# Patient Record
Sex: Male | Born: 1945 | Race: White | Hispanic: No | Smoking: Current some day smoker
Health system: Southern US, Community
[De-identification: ages and names within clinical notes are randomized; demographics above are authoritative.]

## PROBLEM LIST (undated history)

## (undated) DIAGNOSIS — J449 Chronic obstructive pulmonary disease, unspecified: Secondary | ICD-10-CM

## (undated) DIAGNOSIS — K219 Gastro-esophageal reflux disease without esophagitis: Secondary | ICD-10-CM

## (undated) HISTORY — PX: GALLBLADDER SURGERY: SHX652

## (undated) HISTORY — DX: Chronic obstructive pulmonary disease, unspecified: J44.9

## (undated) HISTORY — DX: Gastro-esophageal reflux disease without esophagitis: K21.9

## (undated) HISTORY — PX: TONSILLECTOMY: SUR1361

---

## 2008-12-29 ENCOUNTER — Inpatient Hospital Stay: Payer: Self-pay | Admitting: Internal Medicine

## 2010-08-20 IMAGING — CR DG CHEST 1V PORT
1 series · 1 of 1 positions shown · non-contrast
Comparison: none

REASON FOR EXAM: chest pain
COMMENTS:

PROCEDURE:     DXR - DXR PORTABLE CHEST SINGLE VIEW  - December 28, 2008  [DATE]
RESULT:     The lung fields are clear. No pneumonia, pneumothorax or pleural
effusion is seen. Heart size is normal.

[view not recorded]
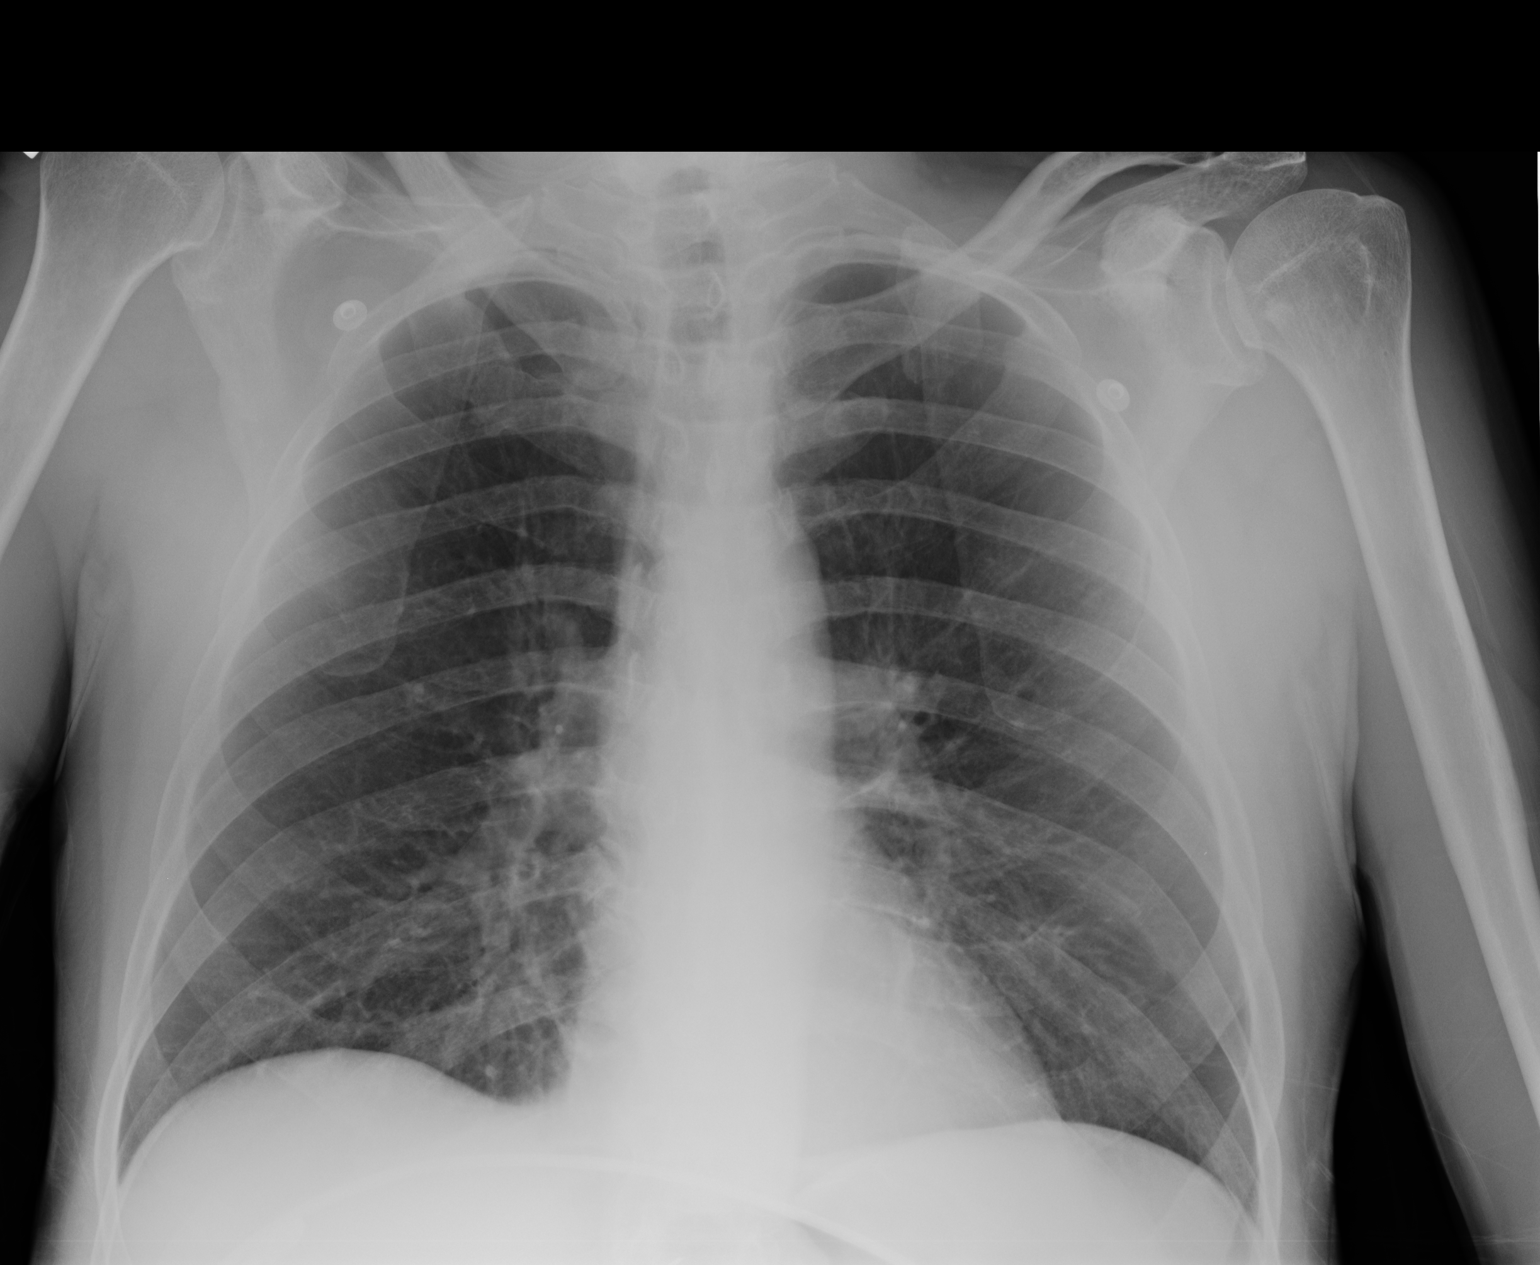

[1 of 1 positions shown; findings below may reference images not displayed]

IMPRESSION: No acute changes are identified.

## 2010-08-20 IMAGING — CT CT CHEST W/ CM
1 of 2 series · 14 of 32 positions shown, 18 images · non-contrast
Comparison: none

REASON FOR EXAM: COMMENTS:

[Series 5: lung windows · axial · 0.74mm/px · z∈[-344,-98]mm · 14 of 98 slices shown, 18 images]
[im 8/98  mediastinal]
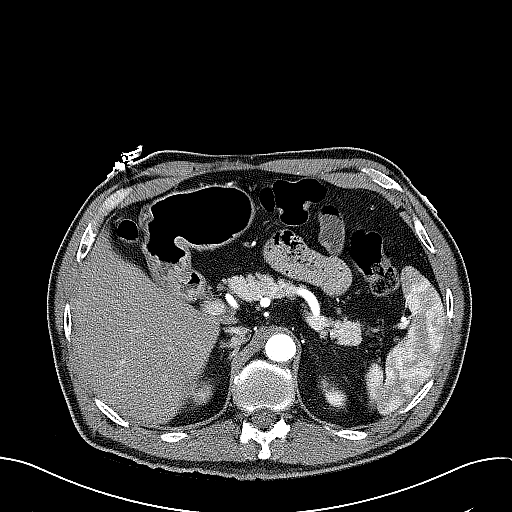
[im 8/98  lung]
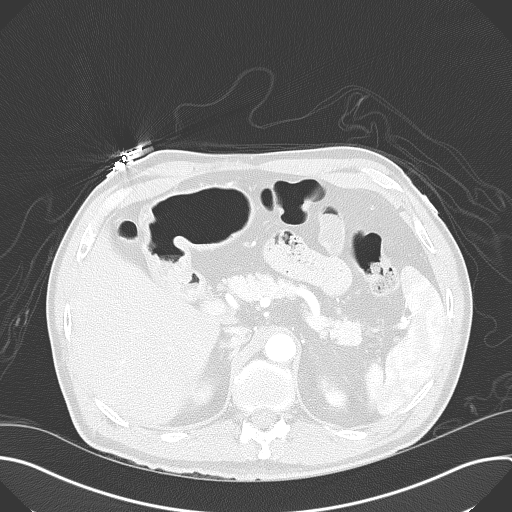
[im 15/98  lung]
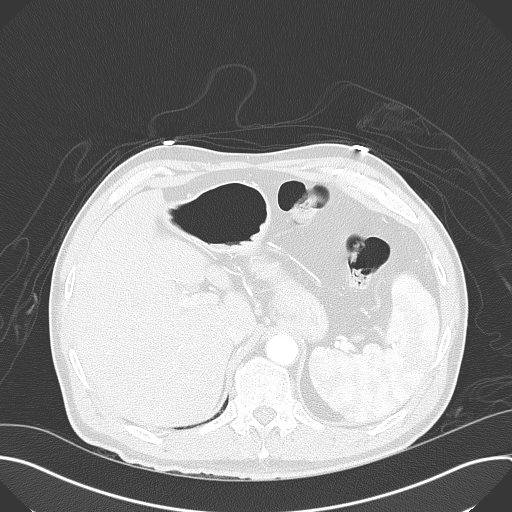
[im 23/98  lung]
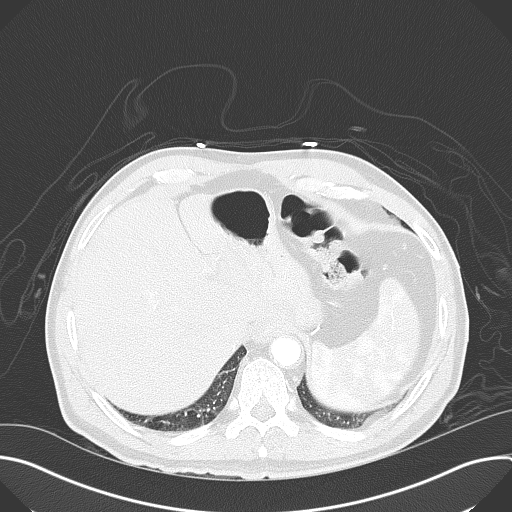
[im 30/98  lung]
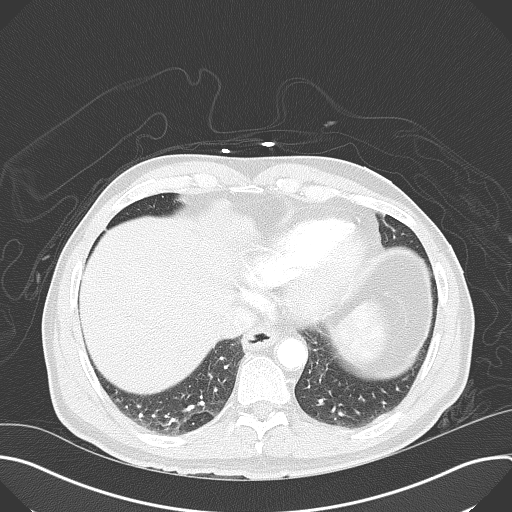
[im 38/98  mediastinal]
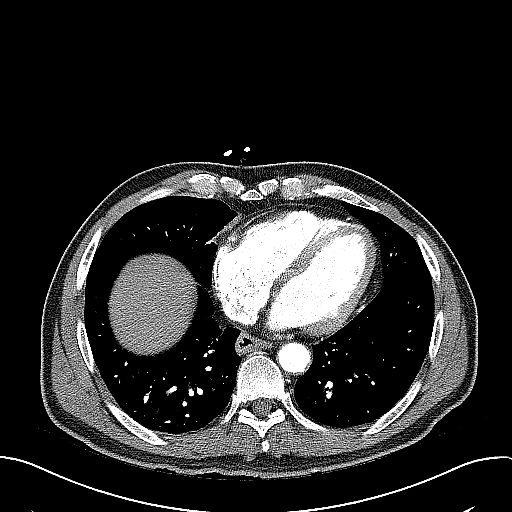
[im 38/98  lung]
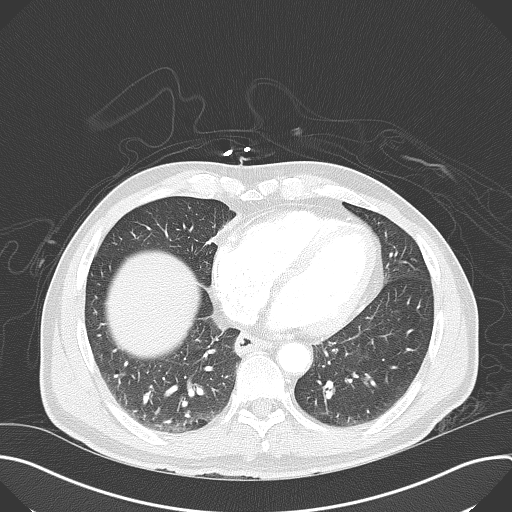
[im 45/98  lung]
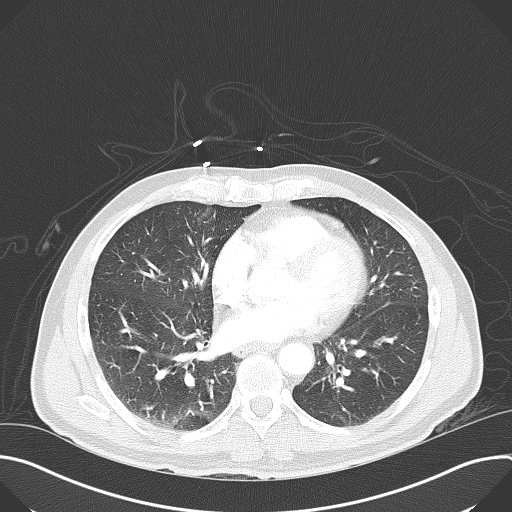
[im 46/98  lung]
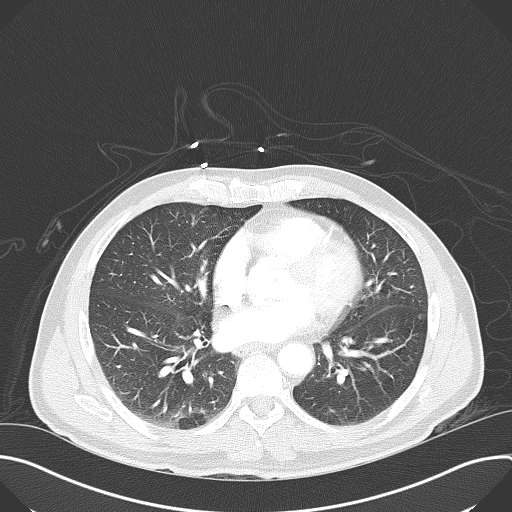
[im 49/98  lung]
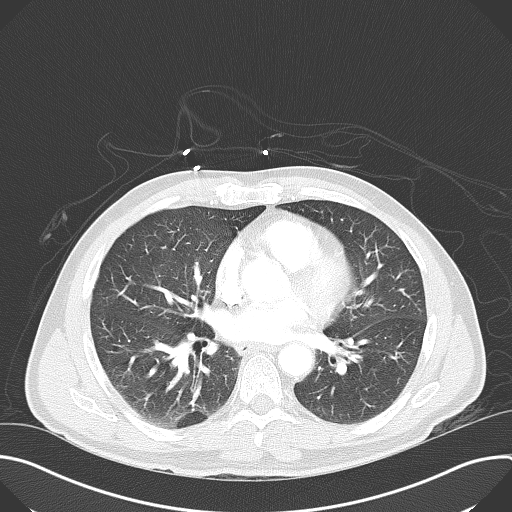
[im 53/98  mediastinal]
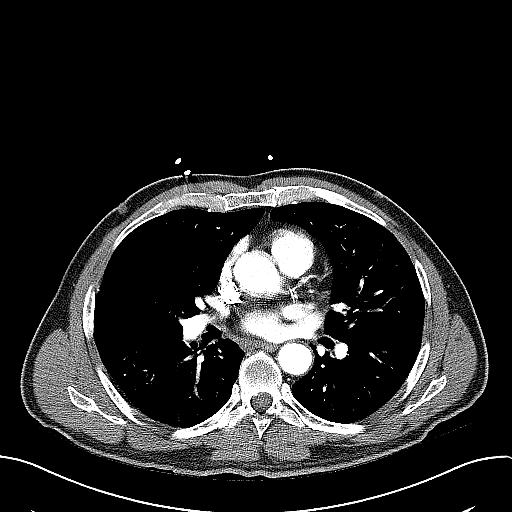
[im 53/98  lung]
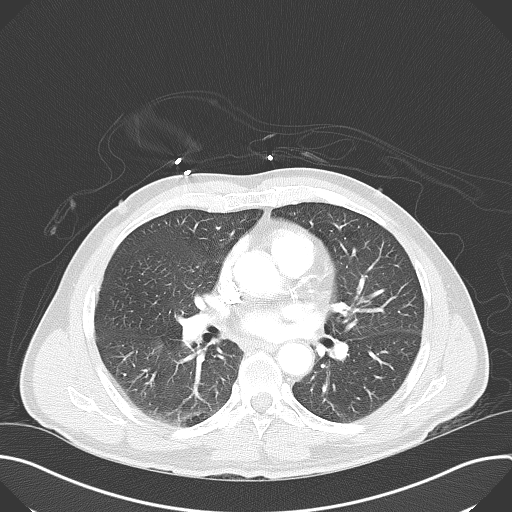
[im 60/98  lung]
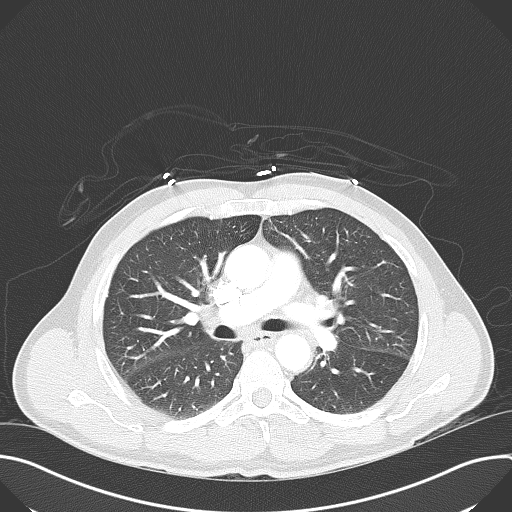
[im 68/98  lung]
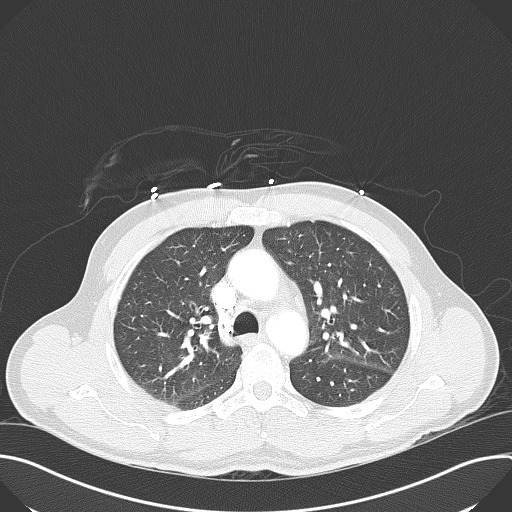
[im 75/98  lung]
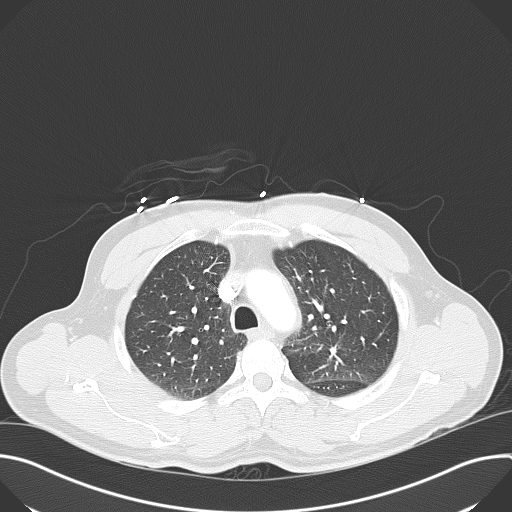
[im 83/98  mediastinal]
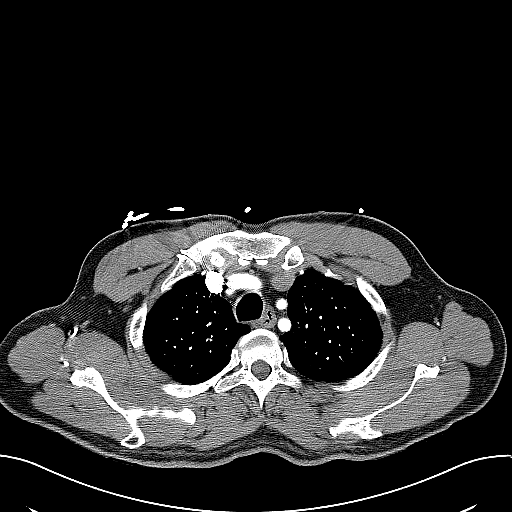
[im 83/98  lung]
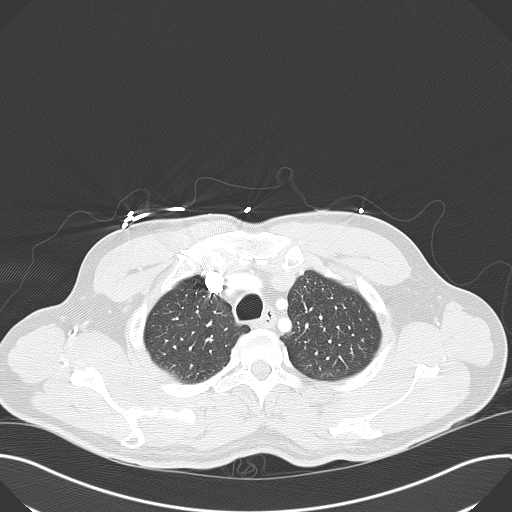
[im 90/98  lung]
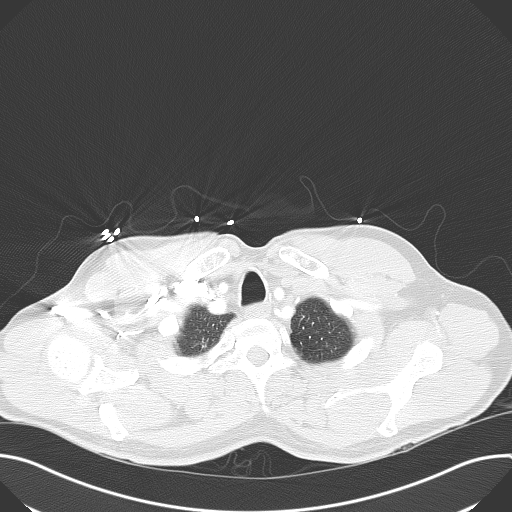

[14 of 32 positions shown; findings below may reference images not displayed]

PROCEDURE:     CT  - CT CHEST (FOR PE) W  - December 28, 2008 [DATE]

RESULT:      Axial CT scanning was performed through the chest at 3 mm
intervals and slice thicknesses following intravenous administration of 100
cc of Ysovue-YTV. Review of 3-dimensional reconstructed images was performed
separately on the [employer] monitor.

Contrast within the pulmonary arterial tree is within the limits of normal.
I do not see evidence of an acute pulmonary embolism. The cardiac chambers
are top normal in size. The caliber of the thoracic aorta is normal. I see
no pathologic sized mediastinal or hilar lymph nodes. The thoracic esophagus
is grossly normal. There is a small hiatal hernia. There is no pleural nor
pericardial effusion. At lung window settings there are mild emphysematous
changes bilaterally. I see no alveolar infiltrates and no significant
pulmonary nodules are identified. Within the upper abdomen the observed
portions of the liver and spleen appear normal. The partially imaged adrenal
glands are normal in appearance. The gallbladder is surgically absent. The
thoracic vertebral bodies are preserved in height. There is lucency noted in
the body of approximately the T4 which is of uncertain significance.
IMPRESSION: 1. I do not see evidence of an acute pulmonary embolism nor of CHF. There
are coronary artery calcifications present.
2. There are emphysematous changes in both lungs without evidence of
alveolar pneumonia.
3. There is no mediastinal nor hilar lymphadenopathy. The caliber of the
thoracic aorta is normal. There is a small hiatal hernia.

A preliminary report was sent to the [HOSPITAL] the conclusion
of the study.

The subtle lucency in the body of T4 is nonspecific. A followup nuclear bone
scan would be a useful next step.

## 2017-12-05 ENCOUNTER — Encounter: Payer: Self-pay | Admitting: Family Medicine

## 2017-12-05 ENCOUNTER — Ambulatory Visit (INDEPENDENT_AMBULATORY_CARE_PROVIDER_SITE_OTHER): Payer: Medicaid Other | Admitting: Family Medicine

## 2017-12-05 VITALS — BP 148/78 | HR 83 | Ht 67.0 in | Wt 147.0 lb

## 2017-12-05 DIAGNOSIS — Z720 Tobacco use: Secondary | ICD-10-CM

## 2017-12-05 DIAGNOSIS — Z7689 Persons encountering health services in other specified circumstances: Secondary | ICD-10-CM | POA: Diagnosis not present

## 2017-12-05 DIAGNOSIS — J441 Chronic obstructive pulmonary disease with (acute) exacerbation: Secondary | ICD-10-CM | POA: Diagnosis not present

## 2017-12-05 DIAGNOSIS — R030 Elevated blood-pressure reading, without diagnosis of hypertension: Secondary | ICD-10-CM | POA: Diagnosis not present

## 2017-12-05 DIAGNOSIS — J449 Chronic obstructive pulmonary disease, unspecified: Secondary | ICD-10-CM | POA: Insufficient documentation

## 2017-12-05 DIAGNOSIS — K219 Gastro-esophageal reflux disease without esophagitis: Secondary | ICD-10-CM

## 2017-12-05 MED ORDER — ALBUTEROL SULFATE (2.5 MG/3ML) 0.083% IN NEBU: 2.5000 mg | INHALATION_SOLUTION | Freq: Four times a day (QID) | RESPIRATORY_TRACT | 1 refills | Status: AC | PRN

## 2017-12-05 MED ORDER — PREDNISONE 20 MG PO TABS: 40.0000 mg | ORAL_TABLET | Freq: Every day | ORAL | 0 refills | Status: AC

## 2017-12-05 MED ORDER — ALBUTEROL SULFATE HFA 108 (90 BASE) MCG/ACT IN AERS: 2.0000 | INHALATION_SPRAY | Freq: Four times a day (QID) | RESPIRATORY_TRACT | 0 refills | Status: DC | PRN

## 2017-12-05 MED ORDER — OMEPRAZOLE 20 MG PO CPDR: 20.0000 mg | DELAYED_RELEASE_CAPSULE | Freq: Every day | ORAL | 3 refills | Status: AC | PRN

## 2017-12-05 MED ORDER — BUDESONIDE-FORMOTEROL FUMARATE 160-4.5 MCG/ACT IN AERO: 2.0000 | INHALATION_SPRAY | Freq: Two times a day (BID) | RESPIRATORY_TRACT | 3 refills | Status: AC

## 2017-12-05 NOTE — Progress Notes (Signed)
BP (!) 148/78   Pulse 83   Ht 5\' 7"  (1.702 m)   Wt 147 lb (66.7 kg)   SpO2 97%   BMI 23.02 kg/m    Subjective:    Patient ID: LEMAN Pope, male    DOB: 09-Feb-1946, 72 y.o.   MRN: [id number]  HPI: Gabriel Pope is a 73 y.o. male  Chief Complaint  Patient presents with  . COPD  . New Patient (Initial Visit)   Here today to establish care. Lost his insurance years ago, previously had been followed by [facility] but has not seen them in several years.   Having difficulty breathing, this has been ongoing for years from uncontrolled COPD. Used to be on albuterol and maintenance inhalers but has not been since he stopped going to [facility]. Causing poor sleep as he will wake up coughing and wheezing. Current smoker. Has been self weaning off cigarettes, down from 3 ppd to 8 cigarettes per day.   Used to on prilosec prn for GERD sxs with good control. Taking alka seltzer 3 days per week now with some relief of reflux sxs. Has tried lifestyle modifications to help with this.   No other known medical issues reported. Will obtain past medical records.    Relevant past medical, surgical, family and social history reviewed and updated as indicated. Interim medical history since our last visit reviewed. Allergies and medications reviewed and updated.  Review of Systems  Per HPI unless specifically indicated above     Objective:    BP (!) 148/78   Pulse 83   Ht 5\' 7"  (1.702 m)   Wt 147 lb (66.7 kg)   SpO2 97%   BMI 23.02 kg/m   Wt Readings from Last 3 Encounters:  12/05/17 147 lb (66.7 kg)    Physical Exam  Constitutional: He is oriented to person, place, and time. He appears well-developed and well-nourished. No distress.  HENT:  Head: Atraumatic.  Eyes: Conjunctivae and EOM are normal.  Neck: Normal range of motion. Neck supple.  Cardiovascular: Normal rate, regular rhythm and normal heart sounds.  Pulmonary/Chest: Effort normal. No respiratory  distress. He has wheezes (diffuse). He has no rales.  Abdominal: Soft. Bowel sounds are normal.  Musculoskeletal: Normal range of motion.  Neurological: He is alert and oriented to person, place, and time.  Skin: Skin is warm and dry.  Psychiatric: He has a normal mood and affect. His behavior is normal.  Nursing note and vitals reviewed.   No results found for this or any previous visit.    Assessment & Plan:   Problem List Items Addressed This Visit      Respiratory   COPD (chronic obstructive pulmonary disease) (HCC) - Primary    Uncontrolled, off inhalers for quite some time now. Will start prednisone x 5 days and symbicort, albuterol neb and inhaler for prn use. Congratulated decrease in smoking, encouraged continued efforts toward cessation      Relevant Medications   albuterol (PROVENTIL) (2.5 MG/3ML) 0.083% nebulizer solution   albuterol (PROVENTIL HFA;VENTOLIN HFA) 108 (90 Base) MCG/ACT inhaler   budesonide-formoterol (SYMBICORT) 160-4.5 MCG/ACT inhaler   predniSONE (DELTASONE) 20 MG tablet     Digestive   GERD (gastroesophageal reflux disease)    Will restart prn prilosec, lifestyle modifications      Relevant Medications   omeprazole (PRILOSEC) 20 MG capsule     Other   Tobacco use    Encouraged continued decrease/cessation. Resources offered. He  wants to continue working on it on his own      Transient elevated blood pressure    Will recheck at next visit and at that time discuss starting medication. DASH diet, exercise, get breathing under better control in meantime       Other Visit Diagnoses    Encounter to establish care           Follow up plan: Return for CPE.

## 2017-12-09 NOTE — Assessment & Plan Note (Signed)
Uncontrolled, off inhalers for quite some time now. Will start prednisone x 5 days and symbicort, albuterol neb and inhaler for prn use. Congratulated decrease in smoking, encouraged continued efforts toward cessation

## 2017-12-09 NOTE — Assessment & Plan Note (Addendum)
Encouraged continued decrease/cessation. Resources offered. He wants to continue working on it on his own

## 2017-12-09 NOTE — Assessment & Plan Note (Signed)
Will recheck at next visit and at that time discuss starting medication. DASH diet, exercise, get breathing under better control in meantime

## 2017-12-09 NOTE — Patient Instructions (Signed)
Follow up for CPE 

## 2017-12-09 NOTE — Assessment & Plan Note (Signed)
Will restart prn prilosec, lifestyle modifications

## 2018-01-03 ENCOUNTER — Encounter: Payer: Self-pay | Admitting: Family Medicine

## 2018-01-03 ENCOUNTER — Other Ambulatory Visit: Payer: Self-pay

## 2018-01-03 ENCOUNTER — Ambulatory Visit: Payer: Medicaid Other | Admitting: Family Medicine

## 2018-01-03 VITALS — BP 132/78 | HR 76 | Temp 98.4°F | Ht 67.0 in | Wt 143.0 lb

## 2018-01-03 DIAGNOSIS — Z1211 Encounter for screening for malignant neoplasm of colon: Secondary | ICD-10-CM

## 2018-01-03 DIAGNOSIS — Z23 Encounter for immunization: Secondary | ICD-10-CM | POA: Diagnosis not present

## 2018-01-03 DIAGNOSIS — Z1159 Encounter for screening for other viral diseases: Secondary | ICD-10-CM

## 2018-01-03 DIAGNOSIS — Z0000 Encounter for general adult medical examination without abnormal findings: Secondary | ICD-10-CM | POA: Diagnosis not present

## 2018-01-03 DIAGNOSIS — J441 Chronic obstructive pulmonary disease with (acute) exacerbation: Secondary | ICD-10-CM

## 2018-01-03 LAB — UA/M W/RFLX CULTURE, ROUTINE
Bilirubin, UA: NEGATIVE
Glucose, UA: NEGATIVE
Ketones, UA: NEGATIVE
LEUKOCYTES UA: NEGATIVE
Nitrite, UA: NEGATIVE
PH UA: 6.5 (ref 5.0–7.5)
PROTEIN UA: NEGATIVE
RBC UA: NEGATIVE
SPEC GRAV UA: 1.01 (ref 1.005–1.030)
Urobilinogen, Ur: 0.2 mg/dL (ref 0.2–1.0)

## 2018-01-03 MED ORDER — FLUTICASONE-UMECLIDIN-VILANT 100-62.5-25 MCG/INH IN AEPB: 1.0000 | INHALATION_SPRAY | Freq: Every day | RESPIRATORY_TRACT | 6 refills | Status: AC

## 2018-01-03 NOTE — Progress Notes (Signed)
BP 132/78   Pulse 76   Temp 98.4 F (36.9 C) (Oral)   Ht 5\' 7"  (1.702 m)   Wt 143 lb (64.9 kg)   SpO2 95%   BMI 22.40 kg/m    Subjective:    Patient ID: Gabriel Pope, male    DOB: 1946/01/28, 72 y.o.   MRN: [id number]  HPI: Gabriel FishLonnie M Cronin is a 72 y.o. male presenting on 01/03/2018 for comprehensive medical examination. Current medical complaints include:none  Last colonoscopy was "a number of years ago".   Working on tapering off his cigarettes.   He currently lives with: Interim Problems from his last visit: no  Depression Screen done today and results listed below:  [facility] 01/03/2018 12/05/2017  Decreased Interest 0 0  Down, Depressed, Hopeless 0 0  PHQ - 2 Score 0 0  Altered sleeping 2 2  Tired, decreased energy 0 0  Change in appetite 0 0  Feeling bad or failure about yourself  0 0  Trouble concentrating 0 0  Moving slowly or fidgety/restless 0 0  Suicidal thoughts 0 0  PHQ-9 Score 2 2    The patient does not have a history of falls. I did not complete a risk assessment for falls. A plan of care for falls was not documented.   Past Medical History:  Past Medical History:  Diagnosis Date  . COPD (chronic obstructive pulmonary disease) (HCC)   . GERD (gastroesophageal reflux disease)     Surgical History:  Past Surgical History:  Procedure Laterality Date  . GALLBLADDER SURGERY    . TONSILLECTOMY      Medications:  Current Outpatient Medications on File Prior to Visit  Medication Sig  . albuterol (PROVENTIL HFA;VENTOLIN HFA) 108 (90 Base) MCG/ACT inhaler Inhale 2 puffs into the lungs every 6 (six) hours as needed for wheezing or shortness of breath.  Marland Kitchen. albuterol (PROVENTIL) (2.5 MG/3ML) 0.083% nebulizer solution Take 3 mLs (2.5 mg total) by nebulization every 6 (six) hours as needed for wheezing or shortness of breath.  . budesonide-formoterol (SYMBICORT) 160-4.5 MCG/ACT inhaler Inhale 2 puffs into the lungs 2 (two) times  daily.  Marland Kitchen. omeprazole (PRILOSEC) 20 MG capsule Take 1 capsule (20 mg total) by mouth daily as needed.  . predniSONE (DELTASONE) 20 MG tablet Take 2 tablets (40 mg total) by mouth daily with breakfast.   No current facility-administered medications on file prior to visit.     Allergies:  No Known Allergies  Social History:  Social History   Socioeconomic History  . Marital status: Divorced    Spouse name: Not on file  . Number of children: Not on file  . Years of education: Not on file  . Highest education level: Not on file  Occupational History  . Not on file  Social Needs  . Financial resource strain: Not on file  . Food insecurity:    Worry: Not on file    Inability: Not on file  . Transportation needs:    Medical: Not on file    Non-medical: Not on file  Tobacco Use  . Smoking status: Current Some Day Smoker    Types: Cigarettes  . Smokeless tobacco: Never Used  Substance and Sexual Activity  . Alcohol use: Never    Frequency: Never  . Drug use: Not Currently  . Sexual activity: Not on file  Lifestyle  . Physical activity:    Days per week: Not on file    Minutes per session: Not  on file  . Stress: Not on file  Relationships  . Social connections:    Talks on phone: Not on file    Gets together: Not on file    Attends religious service: Not on file    Active member of club or organization: Not on file    Attends meetings of clubs or organizations: Not on file    Relationship status: Not on file  . Intimate partner violence:    Fear of current or ex partner: Not on file    Emotionally abused: Not on file    Physically abused: Not on file    Forced sexual activity: Not on file  Other Topics Concern  . Not on file  Social History Narrative  . Not on file   Social History   Tobacco Use  Smoking Status Current Some Day Smoker  . Types: Cigarettes  Smokeless Tobacco Never Used   Social History   Substance and Sexual Activity  Alcohol Use Never  .  Frequency: Never    Family History:  Family History  Problem Relation Age of Onset  . Heart attack Mother   . Kidney disease Father     Past medical history, surgical history, medications, allergies, family history and social history reviewed with patient today and changes made to appropriate areas of the chart.   Review of Systems - General ROS: negative Psychological ROS: negative Ophthalmic ROS: negative ENT ROS: negative Allergy and Immunology ROS: negative Hematological and Lymphatic ROS: negative Endocrine ROS: negative Breast ROS: negative for breast lumps Respiratory ROS: positive for - wheezing Cardiovascular ROS: no chest pain or dyspnea on exertion Gastrointestinal ROS: no abdominal pain, change in bowel habits, or black or bloody stools Genito-Urinary ROS: no dysuria, trouble voiding, or hematuria Musculoskeletal ROS: negative Neurological ROS: no TIA or stroke symptoms Dermatological ROS: negative All other ROS negative except what is listed above and in the HPI.      Objective:    BP 132/78   Pulse 76   Temp 98.4 F (36.9 C) (Oral)   Ht 5\' 7"  (1.702 m)   Wt 143 lb (64.9 kg)   SpO2 95%   BMI 22.40 kg/m   Wt Readings from Last 3 Encounters:  01/03/18 143 lb (64.9 kg)  12/05/17 147 lb (66.7 kg)    Physical Exam  Constitutional: He is oriented to person, place, and time. He appears well-developed and well-nourished. No distress.  HENT:  Head: Atraumatic.  Right Ear: External ear normal.  Left Ear: External ear normal.  Nose: Nose normal.  Mouth/Throat: Oropharynx is clear and moist.  Eyes: Pupils are equal, round, and reactive to light. Conjunctivae are normal. No scleral icterus.  Neck: Normal range of motion. Neck supple.  Cardiovascular: Normal rate, regular rhythm, normal heart sounds and intact distal pulses.  No murmur heard. Pulmonary/Chest: Effort normal. No respiratory distress (minimal). He has wheezes.  Abdominal: Soft. Bowel sounds are  normal. He exhibits no distension and no mass. There is no tenderness. There is no guarding.  Genitourinary:  Genitourinary Comments: Declines GU exam  Musculoskeletal: Normal range of motion. He exhibits no edema or tenderness.  Neurological: He is alert and oriented to person, place, and time. He has normal reflexes.  Skin: Skin is warm and dry. No rash noted.  Psychiatric: He has a normal mood and affect. His behavior is normal.  Nursing note and vitals reviewed.   Results for orders placed or performed in visit on 01/03/18  Hepatitis C antibody  Result Value Ref Range   Hep C Virus Ab <0.1 0.0 - 0.9 s/co ratio  CBC with Differential/Platelet  Result Value Ref Range   WBC 8.4 3.4 - 10.8 x10E3/uL   RBC 4.93 4.14 - 5.80 x10E6/uL   Hemoglobin 14.6 13.0 - 17.7 g/dL   Hematocrit 96.0 45.4 - 51.0 %   MCV 87 79 - 97 fL   MCH 29.6 26.6 - 33.0 pg   MCHC 34.0 31.5 - 35.7 g/dL   RDW 09.8 (L) 11.9 - 14.7 %   Platelets 205 150 - 450 x10E3/uL   Neutrophils 59 Not Estab. %   Lymphs 27 Not Estab. %   Monocytes 6 Not Estab. %   Eos 7 Not Estab. %   Basos 1 Not Estab. %   Neutrophils Absolute 4.9 1.4 - 7.0 x10E3/uL   Lymphocytes Absolute 2.3 0.7 - 3.1 x10E3/uL   Monocytes Absolute 0.5 0.1 - 0.9 x10E3/uL   EOS (ABSOLUTE) 0.6 (H) 0.0 - 0.4 x10E3/uL   Basophils Absolute 0.1 0.0 - 0.2 x10E3/uL   Immature Granulocytes 0 Not Estab. %   Immature Grans (Abs) 0.0 0.0 - 0.1 x10E3/uL  Comprehensive metabolic panel  Result Value Ref Range   Glucose 84 65 - 99 mg/dL   BUN 11 8 - 27 mg/dL   Creatinine, Ser 8.29 0.76 - 1.27 mg/dL   GFR calc non Af Amer 67 >59 mL/min/1.73   GFR calc Af Amer 77 >59 mL/min/1.73   BUN/Creatinine Ratio 10 10 - 24   Sodium 141 134 - 144 mmol/L   Potassium 5.2 3.5 - 5.2 mmol/L   Chloride 102 96 - 106 mmol/L   CO2 25 20 - 29 mmol/L   Calcium 9.8 8.6 - 10.2 mg/dL   Total Protein 7.0 6.0 - 8.5 g/dL   Albumin 4.6 3.5 - 4.8 g/dL   Globulin, Total 2.4 1.5 - 4.5 g/dL    Albumin/Globulin Ratio 1.9 1.2 - 2.2   Bilirubin Total 0.4 0.0 - 1.2 mg/dL   Alkaline Phosphatase 77 39 - 117 IU/L   AST 19 0 - 40 IU/L   ALT 19 0 - 44 IU/L  Lipid Panel w/o Chol/HDL Ratio  Result Value Ref Range   Cholesterol, Total 188 100 - 199 mg/dL   Triglycerides 562 (H) 0 - 149 mg/dL   HDL 31 (L) >13 mg/dL   VLDL Cholesterol Cal 51 (H) 5 - 40 mg/dL   LDL Calculated 086 (H) 0 - 99 mg/dL  UA/M w/rflx Culture, Routine  Result Value Ref Range   Specific Gravity, UA 1.010 1.005 - 1.030   pH, UA 6.5 5.0 - 7.5   Color, UA Yellow Yellow   Appearance Ur Clear Clear   Leukocytes, UA Negative Negative   Protein, UA Negative Negative/Trace   Glucose, UA Negative Negative   Ketones, UA Negative Negative   RBC, UA Negative Negative   Bilirubin, UA Negative Negative   Urobilinogen, Ur 0.2 0.2 - 1.0 mg/dL   Nitrite, UA Negative Negative      Assessment & Plan:   Problem List Items Addressed This Visit      Respiratory   COPD (chronic obstructive pulmonary disease) (HCC)    Improved on symbicort, will start trelegy instead to improve control. Continue tapering on cigarette usage      Relevant Medications   Fluticasone-Umeclidin-Vilant (TRELEGY ELLIPTA) 100-62.5-25 MCG/INH AEPB    Other Visit Diagnoses    Annual physical exam    -  Primary   Relevant Orders  CBC with Differential/Platelet (Completed)   Comprehensive metabolic panel (Completed)   Lipid Panel w/o Chol/HDL Ratio (Completed)   UA/M w/rflx Culture, Routine (Completed)   Screening for colon cancer       Relevant Orders   Ambulatory referral to Gastroenterology   Need for hepatitis C screening test       Relevant Orders   Hepatitis C antibody (Completed)   Need for pneumococcal vaccine       Relevant Orders   Pneumococcal conjugate vaccine 13-valent IM (Completed)   Need for Td vaccine       Relevant Orders   Td vaccine greater than or equal to 7yo preservative free IM (Completed)       Discussed aspirin  prophylaxis for myocardial infarction prevention and decision was it was not indicated  LABORATORY TESTING:  Health maintenance labs ordered today as discussed above.   The natural history of prostate cancer and ongoing controversy regarding screening and potential treatment outcomes of prostate cancer has been discussed with the patient. The meaning of a false positive PSA and a false negative PSA has been discussed. He indicates understanding of the limitations of this screening test and wishes not to proceed with screening PSA testing.   IMMUNIZATIONS:   - Tdap: Tetanus vaccination status reviewed: Td vaccination indicated and given today. - Influenza: Refused - Pneumovax: Not applicable - Prevnar: Administered today - HPV: Not applicable - Zostavax vaccine: Refused  SCREENING: - Colonoscopy: Ordered today  Discussed with patient purpose of the colonoscopy is to detect colon cancer at curable precancerous or early stages   PATIENT COUNSELING:    Sexuality: Discussed sexually transmitted diseases, partner selection, use of condoms, avoidance of unintended pregnancy  and contraceptive alternatives.   Advised to avoid cigarette smoking.  I discussed with the patient that most people either abstain from alcohol or drink within safe limits (<=14/week and <=4 drinks/occasion for males, <=7/weeks and <= 3 drinks/occasion for females) and that the risk for alcohol disorders and other health effects rises proportionally with the number of drinks per week and how often a drinker exceeds daily limits.  Discussed cessation/primary prevention of drug use and availability of treatment for abuse.   Diet: Encouraged to adjust caloric intake to maintain  or achieve ideal body weight, to reduce intake of dietary saturated fat and total fat, to limit sodium intake by avoiding high sodium foods and not adding table salt, and to maintain adequate dietary potassium and calcium preferably from fresh  fruits, vegetables, and low-fat dairy products.    stressed the importance of regular exercise  Injury prevention: Discussed safety belts, safety helmets, smoke detector, smoking near bedding or upholstery.   Dental health: Discussed importance of regular tooth brushing, flossing, and dental visits.   Follow up plan: NEXT PREVENTATIVE PHYSICAL DUE IN 1 YEAR. Return in about 4 weeks (around 01/31/2018) for COPD.

## 2018-01-03 NOTE — Patient Instructions (Signed)
Td Vaccine (Tetanus and Diphtheria): What You Need to Know 1. Why get vaccinated? Tetanus  and diphtheria are very serious diseases. They are rare in the [place] today, but people who do become infected often have severe complications. Td vaccine is used to protect adolescents and adults from both of these diseases. Both tetanus and diphtheria are infections caused by bacteria. Diphtheria spreads from person to person through coughing or sneezing. Tetanus-causing bacteria enter the body through cuts, scratches, or wounds. TETANUS (lockjaw) causes painful muscle tightening and stiffness, usually all over the body.  It can lead to tightening of muscles in the head and neck so you can't open your mouth, swallow, or sometimes even breathe. Tetanus kills about 1 out of every 10 people who are infected even after receiving the best medical care.  DIPHTHERIA can cause a thick coating to form in the back of the throat.  It can lead to breathing problems, paralysis, heart failure, and death.  Before vaccines, as many as 200,000 cases of diphtheria and hundreds of cases of tetanus were reported in the [place] each year. Since vaccination began, reports of cases for both diseases have dropped by about 99%. 2. Td vaccine Td vaccine can protect adolescents and adults from tetanus and diphtheria. Td is usually given as a booster dose every 10 years but it can also be given earlier after a severe and dirty wound or burn. Another vaccine, called Tdap, which protects against pertussis in addition to tetanus and diphtheria, is sometimes recommended instead of Td vaccine. Your doctor or the person giving you the vaccine can give you more information. Td may safely be given at the same time as other vaccines. 3. Some people should not get this vaccine  A person who has ever had a life-threatening allergic reaction after a previous dose of any tetanus or diphtheria containing vaccine, OR has a severe  allergy to any part of this vaccine, should not get Td vaccine. Tell the person giving the vaccine about any severe allergies.  Talk to your doctor if you: ? had severe pain or swelling after any vaccine containing diphtheria or tetanus, ? ever had a condition called Guillain Barre Syndrome (GBS), ? aren't feeling well on the day the shot is scheduled. 4. What are the risks from Td vaccine? With any medicine, including vaccines, there is a chance of side effects. These are usually mild and go away on their own. Serious reactions are also possible but are rare. Most people who get Td vaccine do not have any problems with it. Mild problems following Td vaccine: (Did not interfere with activities)  Pain where the shot was given (about 8 people in 10)  Redness or swelling where the shot was given (about 1 person in 4)  Mild fever (rare)  Headache (about 1 person in 4)  Tiredness (about 1 person in 4)  Moderate problems following Td vaccine: (Interfered with activities, but did not require medical attention)  Fever over 102F (rare)  Severe problems following Td vaccine: (Unable to perform usual activities; required medical attention)  Swelling, severe pain, bleeding and/or redness in the arm where the shot was given (rare).  Problems that could happen after any vaccine:  People sometimes faint after a medical procedure, including vaccination. Sitting or lying down for about 15 minutes can help prevent fainting, and injuries caused by a fall. Tell your doctor if you feel dizzy, or have vision changes or ringing in the ears.  Some people get   severe pain in the shoulder and have difficulty moving the arm where a shot was given. This happens very rarely.  Any medication can cause a severe allergic reaction. Such reactions from a vaccine are very rare, estimated at fewer than 1 in a million doses, and would happen within a few minutes to a few hours after the vaccination. As with any  medicine, there is a very remote chance of a vaccine causing a serious injury or death. The safety of vaccines is always being monitored. For more information, visit: http://floyd.org/ 5. What if there is a serious reaction? What should I look for? Look for anything that concerns you, such as signs of a severe allergic reaction, very high fever, or unusual behavior. Signs of a severe allergic reaction can include hives, swelling of the face and throat, difficulty breathing, a fast heartbeat, dizziness, and weakness. These would usually start a few minutes to a few hours after the vaccination. What should I do?  If you think it is a severe allergic reaction or other emergency that can't wait, call 9-1-1 or get the person to the nearest hospital. Otherwise, call your doctor.  Afterward, the reaction should be reported to the Vaccine Adverse Event Reporting System (VAERS). Your doctor might file this report, or you can do it yourself through the VAERS web site at www.vaers.LAgents.no, or by calling 1-231 453 9421. ? VAERS does not give medical advice. 6. The National Vaccine Injury Compensation Program The [employer] Vaccine Injury Compensation Program (VICP) is a federal program that was created to compensate people who may have been injured by certain vaccines. Persons who believe they may have been injured by a vaccine can learn about the program and about filing a claim by calling 1-956-546-9571 or visiting the VICP website at SpiritualWord.at. There is a time limit to file a claim for compensation. 7. How can I learn more?  Ask your doctor. He or she can give you the vaccine package insert or suggest other sources of information.  Call your local or state health department.  Contact the Centers for Disease Control and Prevention (CDC): ? Call 865-040-5987 (1-800-CDC-INFO) ? Visit CDC's website at PicCapture.uy CDC Td Vaccine VIS (06/01/15) This information is  not intended to replace advice given to you by your health care provider. Make sure you discuss any questions you have with your health care provider. Document Released: 12/04/2005 Document Revised: 10/28/2015 Document Reviewed: 10/28/2015 Elsevier Interactive Patient Education  2017 [employer]. Your Child's First Vaccines: What You Need to Know The vaccines covered on this statement are those most likely to be given during the same visits during infancy and early childhood. Other vaccines (including measles, mumps, and rubella; varicella; rotavirus; influenza; and hepatitis A) are also routinely recommended during the first five years of life. Your child will get these vaccines today: _____DTaP _____Hib _____Hepatitis B _____Polio _____PCV13 (Provider: Check appropriate blanks.) 1. Why get vaccinated? Vaccine-preventable diseases are much less common than they used to be, thanks to vaccination. But they have not gone away. Outbreaks of some of these diseases still occur across the [place]. When fewer babies get vaccinated, more babies get sick. Seven childhood diseases that can be prevented by vaccines: 1. Diphtheria (the 'D' in DTaP vaccine)  Signs and symptoms include a thick coating in the back of the throat that can make it hard to breathe.  Diphtheria can lead to breathing problems, paralysis and heart failure. ? About 15,000 people died each year in the [place] from diphtheria before  there was a vaccine. 2. Tetanus (the 'T' in DTaP vaccine, also known as Lockjaw)  Signs and symptoms include painful tightening of the muscles, usually all over the body.  Tetanus can lead to stiffness of the jaw that can make it difficult to open the mouth or swallow. ? Tetanus kills about 1 person out of every 10 who get it. 3. Pertussis (the 'P' in DTaP vaccine, also known as Whooping Cough)  Signs and symptoms include violent coughing spells that can make it hard for a baby to eat, drink, or  breathe. These spells can last for several weeks.  Pertussis can lead to pneumonia, seizures, brain damage, or death. Pertussis can be very dangerous in infants. ? Most pertussis deaths are in babies younger than 53 months of age. 4. Hib ( Haemophilus influenzae type b)  Signs and symptoms can include fever, headache, stiff neck, cough, and shortness of breath. There might not be any signs or symptoms in mild cases.  Hib can lead to meningitis (infection of the brain and spinal cord coverings); pneumonia; infections of the ears, sinuses, blood, joints, bones, and covering of the heart; brain damage; severe swelling of the throat, making it hard to breathe; and deafness. ? Children younger than 79 years of age are at greatest risk for Hib disease. 5. Hepatitis B  Signs and symptoms include tiredness, diarrhea and vomiting, jaundice (yellow skin or eyes), and pain in muscles, joints and stomach. But usually there are no signs or symptoms at all.  Hepatitis B can lead to liver damage, and liver cancer. Some people develop chronic (long term) hepatitis B infection. These people might not look or feel sick, but they can infect others. ? Hepatitis B can cause liver damage and cancer in 1 child out of 4 who are chronically infected. 6. Polio  Signs and symptoms can include flu-like illness, or there may be no signs or symptoms at all.  Polio can lead to permanent paralysis (can't move an arm or leg, or sometimes can't breathe) and death. ? In the 1950s, polio paralyzed more than 15,000 people every year in the [place] 7. Pneumococcal disease  Signs and symptoms include fever, chills, cough, and chest pain. In infants, symptoms can also include meningitis, seizures, and sometimes rash.  Pneumococcal disease can lead to meningitis (infection of the brain and spinal cord coverings); infections of the ears, sinuses and blood; pneumonia; deafness; and brain damage. ? About 1 out of 15 children who get  pneumococcal meningitis will die from the infection. Children usually catch these diseases from other children or adults, who might not even know they are infected. A mother infected with hepatitis B can infect her baby at birth. Tetanus enters the body through a cut or wound; it is not spread from person to person. Vaccines that protect your baby from these seven diseases:  Vaccine: DTaP (Diphtheria, Tetanus, Pertussis) ? Number of doses: 5 ? Recommended ages: 2 months, 4 months, 6 months, 15-18 months, 4-6 years ? Other information: Some children get a vaccine called DT (Diphtheria &amp; tetanus) instead of DTaP.  Vaccine: Hepatitis B ? Number of doses: 3 ? Recommended ages: Birth, 1-2 months, 6-18 months  Vaccine: Polio ? Number of doses: 4 ? Recommended ages: 2 months, 4 months, 6-18 months, 4-6 years ? Other information: An additional dose of polio vaccine may be recommended for travel to certain countries.  Vaccine: Hib (Haemophilus influenzae type b) ? Number of doses: 3 or 4 ? Recommended ages:  2 months, 4 months, (6 months), 12-15 months ? Other information: There are several Hib vaccines. With one of them the 29-month dose is not needed.  Vaccine: Pneumococcal (PCV13) ? Number of doses: 4 ? Recommended ages: 2 months, 4 months, 6 months, 12-15 months ? Other information: Older children with certain health conditions also need this vaccine. Your healthcare provider might offer some of these vaccines as combination vaccines-several vaccines given in the same shot. Combination vaccines are as safe and effective as the individual vaccines, and can mean fewer shots for your baby. 2. Some children should not get certain vaccines Most children can safely get all of these vaccines. But there are some exceptions:  A child who has a mild cold or other illness on the day vaccinations are scheduled may be vaccinated. A child who is moderately or severely ill on the day of vaccinations  might be asked to come back for them at a later date.  Any child who had a life-threatening allergic reaction after getting a vaccine should not get another dose of that vaccine. Tell the person giving the vaccines if your child has ever had a severe reaction after any vaccination.  A child who has a severe (life-threatening) allergy to a substance should not get a vaccine that contains that substance. Tell the person giving your child the vaccines if your child has any severe allergies that you are aware of.  Talk to your doctor before your child gets:  DTaP vaccine, if your child ever had any of these reactions after a previous dose of DTaP: ? A brain or nervous system disease within 7 days, ? Non-stop crying for 3 hours or more, ? A seizure or collapse, ? A fever of over 105F.  PCV13 vaccine, if your child ever had a severe reaction after a dose of DTaP (or other vaccine containing diphtheria toxoid), or after a dose of PCV7, an earlier pneumococcal vaccine. 3. What are the risks of a vaccine reaction? With any medicine, including vaccines, there is a chance of side effects. These are usually mild and go away on their own. Most vaccine reactions are not serious: tenderness, redness, or swelling where the shot was given; or a mild fever. These happen soon after the shot is given and go away within a day or two. They happen with up to about half of vaccinations, depending on the vaccine. Serious reactions are also possible but are rare. Polio, Hepatitis B and Hib vaccines have been associated only with mild reactions. DTaP and pneumococcal vaccines have also been associated with other problems:  DTaP vaccine ? Mild problems: Fussiness (up to 1 child in 3); tiredness or loss of appetite (up to 1 child in 10); vomiting (up to 1 child in 50); swelling of the entire arm or leg for 1-7 days (up to 1 child in 30)-usually after the 4th or 5th dose. ? Moderate problems: Seizure (1 child in 14,000);  non-stop crying for 3 hours or longer (up to 1 child in 1,000); fever over 105F (1 child in 16,000). ? Serious problems: Long term seizures, coma, lowered consciousness, and permanent brain damage have been reported following DTaP vaccination. These reports are extremely rare.  Pneumococcal vaccine ? Mild problems: Drowsiness or temporary loss of appetite (about 1 child in 2 or 3); fussiness (about 8 children in 10). ? Moderate problems: Fever over 102.54F (about 1 child in 20).  After any vaccine: Any medication can cause a severe allergic reaction. Such reactions from  a vaccine are very rare, estimated at about 1 in a million doses, and would happen within a few minutes to a few hours after the vaccination. As with any medicine, there is a very remote chance of a vaccine causing a serious injury or death. The safety of vaccines is always being monitored. For more information, visit: http://floyd.org/www.cdc.gov/vaccinesafety/ 4. What if there is a serious reaction? What should I look for? Look for anything that concerns you, such as signs of a severe allergic reaction, very high fever, or unusual behavior. Signs of a severe allergic reaction can include hives, swelling of the face and throat, and difficulty breathing. In infants, signs of an allergic reaction might also include fever, sleepiness, and disinterest in eating. In older children signs might include a fast heartbeat, dizziness, and weakness. These would usually start a few minutes to a few hours after the vaccination. What should I do?  If you think it is a severe allergic reaction or other emergency that can't wait, call 9-1-1 or get the person to the nearest hospital. Otherwise, call your doctor.  Afterward, the reaction should be reported to the Vaccine Adverse Event Reporting System (VAERS). Your doctor should file this report, or you can do it yourself through the VAERS web site at www.vaers.LAgents.nohhs.gov, or by calling 1-(808) 029-6529.  VAERS does  not give medical advice. 5. The National Vaccine Injury Compensation Program The National Vaccine Injury Compensation Program (VICP) is a federal program that was created to compensate people who may have been injured by certain vaccines. Persons who believe they may have been injured by a vaccine can learn about the program and about filing a claim by calling 1-912-783-2888 or visiting the VICP website at SpiritualWord.atwww.hrsa.gov/vaccinecompensation. There is a time limit to file a claim for compensation. 6. How can I learn more?  Ask your healthcare provider. He or she can give you the vaccine package insert or suggest other sources of information.  Call your local or state health department.  Contact the Centers for Disease Control and Prevention (CDC): ? Call 30629145601-873-405-9681 (1-800-CDC-INFO) ? Visit CDC's website at PicCapture.uywww.cdc.gov/vaccines or SaveSearches.co.nzwww.cdc.gov/hepatitis Vaccine Information Statement, Multi Pediatric Vaccines (12/25/2013) This information is not intended to replace advice given to you by your health care provider. Make sure you discuss any questions you have with your health care provider. Document Released: 07/26/2007 Document Revised: 12/22/2015 Document Reviewed: 12/22/2015 Elsevier Interactive Patient Education  2017 [employer] Pneumococcal Conjugate Vaccine (PCV13) What You Need to Know 1. Why get vaccinated? Vaccination can protect both children and adults from pneumococcal disease. Pneumococcal disease is caused by bacteria that can spread from person to person through close contact. It can cause ear infections, and it can also lead to more serious infections of the:  Lungs (pneumonia),  Blood (bacteremia), and  Covering of the brain and spinal cord (meningitis).  Pneumococcal pneumonia is most common among adults. Pneumococcal meningitis can cause deafness and brain damage, and it kills about 1 child in 10 who get it. Anyone can get pneumococcal disease, but children under 122  years of age and adults 2865 years and older, people with certain medical conditions, and cigarette smokers are at the highest risk. Before there was a vaccine, the [place] saw:  more than 700 cases of meningitis,  about 13,000 blood infections,  about 5 million ear infections, and  about 200 deaths  in children under 5 each year from pneumococcal disease. Since vaccine became available, severe pneumococcal disease in these children has fallen  by 88%. About 18,000 older adults die of pneumococcal disease each year in the [place]. Treatment of pneumococcal infections with penicillin and other drugs is not as effective as it used to be, because some strains of the disease have become resistant to these drugs. This makes prevention of the disease, through vaccination, even more important. 2. PCV13 vaccine Pneumococcal conjugate vaccine (called PCV13) protects against 13 types of pneumococcal bacteria. PCV13 is routinely given to children at 2, 4, 6, and 34-89 months of age. It is also recommended for children and adults 8 to 34 years of age with certain health conditions, and for all adults 54 years of age and older. Your doctor can give you details. 3. Some people should not get this vaccine Anyone who has ever had a life-threatening allergic reaction to a dose of this vaccine, to an earlier pneumococcal vaccine called PCV7, or to any vaccine containing diphtheria toxoid (for example, DTaP), should not get PCV13. Anyone with a severe allergy to any component of PCV13 should not get the vaccine. Tell your doctor if the person being vaccinated has any severe allergies. If the person scheduled for vaccination is not feeling well, your healthcare provider might decide to reschedule the shot on another day. 4. Risks of a vaccine reaction With any medicine, including vaccines, there is a chance of reactions. These are usually mild and go away on their own, but serious reactions are also  possible. Problems reported following PCV13 varied by age and dose in the series. The most common problems reported among children were:  About half became drowsy after the shot, had a temporary loss of appetite, or had redness or tenderness where the shot was given.  About 1 out of 3 had swelling where the shot was given.  About 1 out of 3 had a mild fever, and about 1 in 20 had a fever over 102.91F.  Up to about 8 out of 10 became fussy or irritable.  Adults have reported pain, redness, and swelling where the shot was given; also mild fever, fatigue, headache, chills, or muscle pain. Young children who get PCV13 along with inactivated flu vaccine at the same time may be at increased risk for seizures caused by fever. Ask your doctor for more information. Problems that could happen after any vaccine:  People sometimes faint after a medical procedure, including vaccination. Sitting or lying down for about 15 minutes can help prevent fainting, and injuries caused by a fall. Tell your doctor if you feel dizzy, or have vision changes or ringing in the ears.  Some older children and adults get severe pain in the shoulder and have difficulty moving the arm where a shot was given. This happens very rarely.  Any medication can cause a severe allergic reaction. Such reactions from a vaccine are very rare, estimated at about 1 in a million doses, and would happen within a few minutes to a few hours after the vaccination. As with any medicine, there is a very small chance of a vaccine causing a serious injury or death. The safety of vaccines is always being monitored. For more information, visit: http://floyd.org/ 5. What if there is a serious reaction? What should I look for? Look for anything that concerns you, such as signs of a severe allergic reaction, very high fever, or unusual behavior. Signs of a severe allergic reaction can include hives, swelling of the face and throat, difficulty  breathing, a fast heartbeat, dizziness, and weakness-usually within a few minutes to  a few hours after the vaccination. What should I do?  If you think it is a severe allergic reaction or other emergency that can't wait, call 9-1-1 or get the person to the nearest hospital. Otherwise, call your doctor.  Reactions should be reported to the Vaccine Adverse Event Reporting System (VAERS). Your doctor should file this report, or you can do it yourself through the VAERS web site at www.vaers.LAgents.no, or by calling 1-912-021-4094. ? VAERS does not give medical advice. 6. The National Vaccine Injury Compensation Program The [employer] Vaccine Injury Compensation Program (VICP) is a federal program that was created to compensate people who may have been injured by certain vaccines. Persons who believe they may have been injured by a vaccine can learn about the program and about filing a claim by calling 1-(847) 624-4140 or visiting the VICP website at SpiritualWord.at. There is a time limit to file a claim for compensation. 7. How can I learn more?  Ask your healthcare provider. He or she can give you the vaccine package insert or suggest other sources of information.  Call your local or state health department.  Contact the Centers for Disease Control and Prevention (CDC): ? Call 612-581-9242 (1-800-CDC-INFO) or ? Visit CDC's website at PicCapture.uy Vaccine Information Statement, PCV13 Vaccine (12/25/2013) This information is not intended to replace advice given to you by your health care provider. Make sure you discuss any questions you have with your health care provider. Document Released: 12/04/2005 Document Revised: 10/28/2015 Document Reviewed: 10/28/2015 Elsevier Interactive Patient Education  2017 [employer].

## 2018-01-04 ENCOUNTER — Ambulatory Visit: Payer: Medicaid Other | Admitting: Family Medicine

## 2018-01-04 ENCOUNTER — Other Ambulatory Visit: Payer: Self-pay

## 2018-01-04 LAB — CBC WITH DIFFERENTIAL/PLATELET
Basophils Absolute: 0.1 10*3/uL (ref 0.0–0.2)
Basos: 1 %
EOS (ABSOLUTE): 0.6 10*3/uL — AB (ref 0.0–0.4)
Eos: 7 %
HEMOGLOBIN: 14.6 g/dL (ref 13.0–17.7)
Hematocrit: 42.9 % (ref 37.5–51.0)
Immature Grans (Abs): 0 10*3/uL (ref 0.0–0.1)
Immature Granulocytes: 0 %
LYMPHS ABS: 2.3 10*3/uL (ref 0.7–3.1)
Lymphs: 27 %
MCH: 29.6 pg (ref 26.6–33.0)
MCHC: 34 g/dL (ref 31.5–35.7)
MCV: 87 fL (ref 79–97)
MONOS ABS: 0.5 10*3/uL (ref 0.1–0.9)
Monocytes: 6 %
Neutrophils Absolute: 4.9 10*3/uL (ref 1.4–7.0)
Neutrophils: 59 %
PLATELETS: 205 10*3/uL (ref 150–450)
RBC: 4.93 x10E6/uL (ref 4.14–5.80)
RDW: 11.8 % — AB (ref 12.3–15.4)
WBC: 8.4 10*3/uL (ref 3.4–10.8)

## 2018-01-04 LAB — COMPREHENSIVE METABOLIC PANEL
ALBUMIN: 4.6 g/dL (ref 3.5–4.8)
ALK PHOS: 77 IU/L (ref 39–117)
ALT: 19 IU/L (ref 0–44)
AST: 19 IU/L (ref 0–40)
Albumin/Globulin Ratio: 1.9 (ref 1.2–2.2)
BILIRUBIN TOTAL: 0.4 mg/dL (ref 0.0–1.2)
BUN/Creatinine Ratio: 10 (ref 10–24)
BUN: 11 mg/dL (ref 8–27)
CHLORIDE: 102 mmol/L (ref 96–106)
CO2: 25 mmol/L (ref 20–29)
Calcium: 9.8 mg/dL (ref 8.6–10.2)
Creatinine, Ser: 1.11 mg/dL (ref 0.76–1.27)
GFR calc Af Amer: 77 mL/min/{1.73_m2} (ref >59)
GFR calc non Af Amer: 67 mL/min/{1.73_m2} (ref >59)
GLUCOSE: 84 mg/dL (ref 65–99)
Globulin, Total: 2.4 g/dL (ref 1.5–4.5)
Potassium: 5.2 mmol/L (ref 3.5–5.2)
Sodium: 141 mmol/L (ref 134–144)
Total Protein: 7 g/dL (ref 6.0–8.5)

## 2018-01-04 LAB — HEPATITIS C ANTIBODY: Hep C Virus Ab: <0.1 s/co ratio (ref 0.0–0.9)

## 2018-01-04 LAB — LIPID PANEL W/O CHOL/HDL RATIO
CHOLESTEROL TOTAL: 188 mg/dL (ref 100–199)
HDL: 31 mg/dL — ABNORMAL LOW (ref >39)
LDL Calculated: 106 mg/dL — ABNORMAL HIGH (ref 0–99)
Triglycerides: 256 mg/dL — ABNORMAL HIGH (ref 0–149)
VLDL CHOLESTEROL CAL: 51 mg/dL — AB (ref 5–40)

## 2018-01-04 NOTE — Assessment & Plan Note (Signed)
Improved on symbicort, will start trelegy instead to improve control. Continue tapering on cigarette usage

## 2018-01-07 ENCOUNTER — Other Ambulatory Visit: Payer: Self-pay | Admitting: Family Medicine

## 2018-01-07 ENCOUNTER — Encounter: Payer: Self-pay | Admitting: Family Medicine

## 2018-01-21 ENCOUNTER — Ambulatory Visit: Admission: RE | Admit: 2018-01-21 | Payer: Medicaid Other | Source: Ambulatory Visit | Admitting: Gastroenterology

## 2018-01-21 ENCOUNTER — Encounter: Admission: RE | Payer: Self-pay | Source: Ambulatory Visit

## 2018-01-21 SURGERY — COLONOSCOPY WITH PROPOFOL
Anesthesia: General

## 2018-01-31 ENCOUNTER — Ambulatory Visit: Payer: Medicaid Other | Admitting: Family Medicine

## 2018-02-20 DEATH — deceased
